# Patient Record
Sex: Male | Born: 2000 | Race: White | Hispanic: No | Marital: Single | Smoking: Never smoker
Health system: Southern US, Community
[De-identification: ages and names within clinical notes are randomized; demographics above are authoritative.]

---
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
## 2021-03-28 ENCOUNTER — Encounter: Payer: Self-pay | Admitting: Family Medicine

## 2021-03-28 ENCOUNTER — Ambulatory Visit (INDEPENDENT_AMBULATORY_CARE_PROVIDER_SITE_OTHER): Payer: 59 | Admitting: Family Medicine

## 2021-03-28 ENCOUNTER — Other Ambulatory Visit: Payer: Self-pay

## 2021-03-28 ENCOUNTER — Ambulatory Visit
Admission: RE | Admit: 2021-03-28 | Discharge: 2021-03-28 | Disposition: A | Discharge status: Home / Self Care | Payer: 59 | Source: Ambulatory Visit | Attending: Family Medicine | Admitting: Family Medicine

## 2021-03-28 VITALS — BP 124/78 | Ht 74.5 in | Wt 204.0 lb

## 2021-03-28 DIAGNOSIS — M25561 Pain in right knee: Secondary | ICD-10-CM

## 2021-03-28 DIAGNOSIS — S838X1A Sprain of other specified parts of right knee, initial encounter: Secondary | ICD-10-CM

## 2021-03-28 DIAGNOSIS — S83411A Sprain of medial collateral ligament of right knee, initial encounter: Secondary | ICD-10-CM

## 2021-03-28 NOTE — Progress Notes (Signed)
   Azim Gillingham is a 20 y.o. male who presents to [facility] today for the following:  Right knee pain: On 7/6, he was squatting for max weight weight 455lbs and had to bail but did it with improper technique and had immediate pain that feels like a stretching pain behind his knee.  Also noted right medial pain near the same time that improved but is now worsening. Has to limp when walking  Knee is swollen but improved from prior Never had injury or surgery to the area. Has been using ice and ibuprofen with minimal improvement Pain worse with full extension    PMH reviewed.  ROS as above. Medications reviewed.  Exam:  BP 124/78   Ht 6' 2.5" (1.892 m)   Wt 204 lb (92.5 kg)   BMI 25.84 kg/m  Gen: Well-appearing, NAD MSK:  Right Knee: - Inspection: no gross deformity. Mild swelling of the posterior knee. No  erythema or bruising. Skin intact - Palpation: TTP over posterior and medial knee - ROM: full active ROM with flexion and extension in knee and hip bilaterally, though pain with full extension of the right knee - Strength: 5/5 strength - Neuro/vasc: NV intact - Special Tests: - LIGAMENTS: negative anterior and posterior drawer, negative Lachman's, no MCL or LCL laxity.  Negative dial.  Pain on MCL stress. -- MENISCUS: Mildly positive McMurray's, [place]ss[place].  Negative apley. -- PF JOINT: nml patellar mobility bilaterally.  negative patellar grind, negative patellar apprehension  Limited MSK u/s right knee: no effusion.  No visible medial meniscus tear.  No baker's cyst.  Assessment and Plan: 1) Right knee pain - consistent with Grade 1 MCL sprain, meniscal contusion.  Radiographs with tunnel view to assess for osteochondral defect.  Reassuring he doesn't have an effusion as well and video of injury without twisting injury (fell forward with some valgus to right knee).  Icing, ibuprofen.  Knee brace for support.  Out of work until f/u in 1.5 weeks.  Consider MRI if not  improving.   Ranald Alessio, DO

## 2021-03-28 NOTE — Patient Instructions (Signed)
Get x-rays of your knee after you leave today - we will contact you with results. You have an MCL sprain and medial meniscus contusion. Ice the knee 15 minutes at a time 3-4 times a day. Ibuprofen 600mg  three times a day with food for pain and inflammation - consider taking regularly for the next week then as needed. Knee brace when up and walking around. Out of work until I see you back next Wednesday. If x-rays look ok and you're not improving we will consider an MRI but your exam is reassuring otherwise. Follow up with me next Wednesday if that works with your schedule.

## 2021-04-06 ENCOUNTER — Encounter: Payer: Self-pay | Admitting: Family Medicine

## 2021-04-06 ENCOUNTER — Ambulatory Visit (INDEPENDENT_AMBULATORY_CARE_PROVIDER_SITE_OTHER): Payer: 59 | Admitting: Family Medicine

## 2021-04-06 ENCOUNTER — Other Ambulatory Visit: Payer: Self-pay

## 2021-04-06 ENCOUNTER — Ambulatory Visit: Payer: 59 | Admitting: Family Medicine

## 2021-04-06 VITALS — Ht 74.5 in | Wt 205.0 lb

## 2021-04-06 DIAGNOSIS — S83411D Sprain of medial collateral ligament of right knee, subsequent encounter: Secondary | ICD-10-CM | POA: Diagnosis not present

## 2021-04-06 DIAGNOSIS — M25561 Pain in right knee: Secondary | ICD-10-CM | POA: Diagnosis not present

## 2021-04-06 NOTE — Progress Notes (Signed)
PCP: Garey Ham, MD  Subjective:   HPI: Patient is a 20 y.o. male here for follow-up of right knee pain.  Patient initially had injury on 03/23/2021 when he bailed from a max weight of squatting and had pain in his right medial knee.  Since then he has had x-rays that did not show any bony abnormality or fracture, no joint effusion or degenerative changes.  Today, the patient states that he feels much improved, feels like he is back to his previous baseline.  He has been walking, taking stairs, and some light jogging without any difficulty.  He has refrained from heavy lower body weight lifting.  He has been taking ibuprofen as needed for the pain, but this has been resolved over the last few days.  He denies any instability or giving out of the knee, reports edema in the knee has completely resolved.  He is inquiring about returning to work.  Ht 6' 2.5" (1.892 m)   Wt 205 lb (93 kg)   BMI 25.97 kg/m   [facility] 03/28/2021  Frequency of aerobic exercise (# of days/week) 0  Average time in minutes 0  Frequency of strengthening activities (# of days/week) 6    No flowsheet data found.  Review of Systems: See HPI above.     Objective:  Physical Exam:  Gen: Well-appearing, in no acute distress; non-toxic CV: Regular Rate. Well-perfused. Warm.  Resp: Breathing unlabored on room air; no wheezing. Psych: Fluid speech in conversation; appropriate affect; normal thought process Neuro: Sensation intact throughout. No gross coordination deficits.  MSK:   - Knee, right: Inspection was negative for erythema, ecchymosis, or effusion.  There were no obvious bony abnormalities.  There is no tenderness to palpation over the medial or lateral joint line, or patella.  There is no significant tenderness over the MCL insertion point.  5/5 strength of bilateral lower extremities in all directions at the knee and ankle.  No obvious Baker's cyst development palpated.  Range  of motion is full from 0 degrees in extension to approximately 135 degrees in flexion.  There is no instability or laxity with varus or valgus stress at 0 and 15 degrees, the MCL does feel intact with a firm endpoint.  Patient is neurovascular intact, gross sensation to light touch intact on plantar and dorsal aspect of the lower extremity.  Provocative Testing: Negative varus/valgus stress testing, McMurray's, anterior/posterior drawer.   Assessment & Plan:  1.  Right knee MCL sprain, grade 1 - improved.  Since last visit, patient's pain and function has completely improved.  He is ambulating, walking steps, and light jogging without any pain or instability.  Physical exam today shows an intact MCL without any laxity or pain with provocative measures.  I provided a return to work note for him, he may resume normal activity, although we discussed being cautious returning to activities that would cause rotation of the knee and weightlifting--begin at about 50%, and increase by  about 10% each session until back to baseline.  He may use NSAID as needed.  Return only as needed unless issues arise.  Return precautions provided.  Madelyn Brunner, DO PGY-4, Sports Medicine Fellow [facility]

## 2021-04-06 NOTE — Patient Instructions (Signed)
It was great to meet you today, thank you for letting me participate in your care!  Today, we discussed returning to work from your MCL sprain. It generally takes around 2 weeks (+/- a week) for healing. Your knee feels stable today on exam, so you can return to work and activity. Let pain be your guide and slowly ramp up on activities that will cause flexion/extension and especially rotation of the knee. Start back with squats, deadlift, etc. At about 50% of max/usual weight and slowly ramp up over the next 1-2 weeks.  You will follow-up only as needed if you do not feel back to 100%.  If you have any further questions, please give the clinic a call [phone].  Cheers,  Madelyn Brunner, DO PGY-4, Sports Medicine Fellow [facility]

## 2022-09-08 IMAGING — DX DG KNEE COMPLETE 4+V*R*
4 series · 4 of 4 positions shown · non-contrast
Comparison: None.

CLINICAL DATA: Acute right lateral knee pain, no reported injury.

EXAM:
RIGHT KNEE - COMPLETE 4+ VIEW

[dg knee complete 4 views right (1 of 4)]
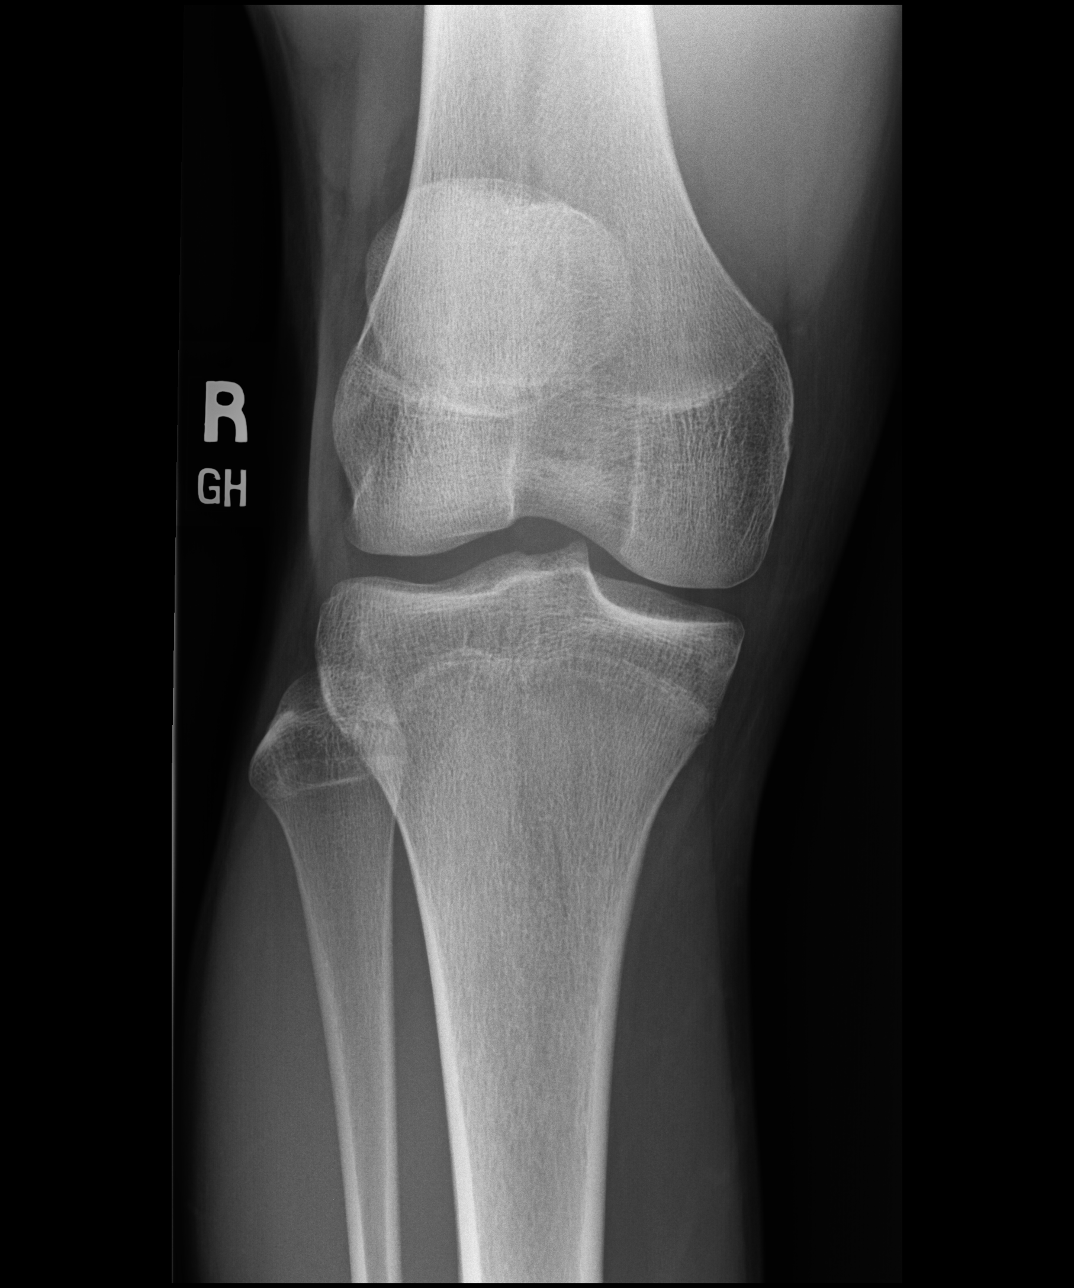

[dg knee complete 4 views right (2 of 4)]
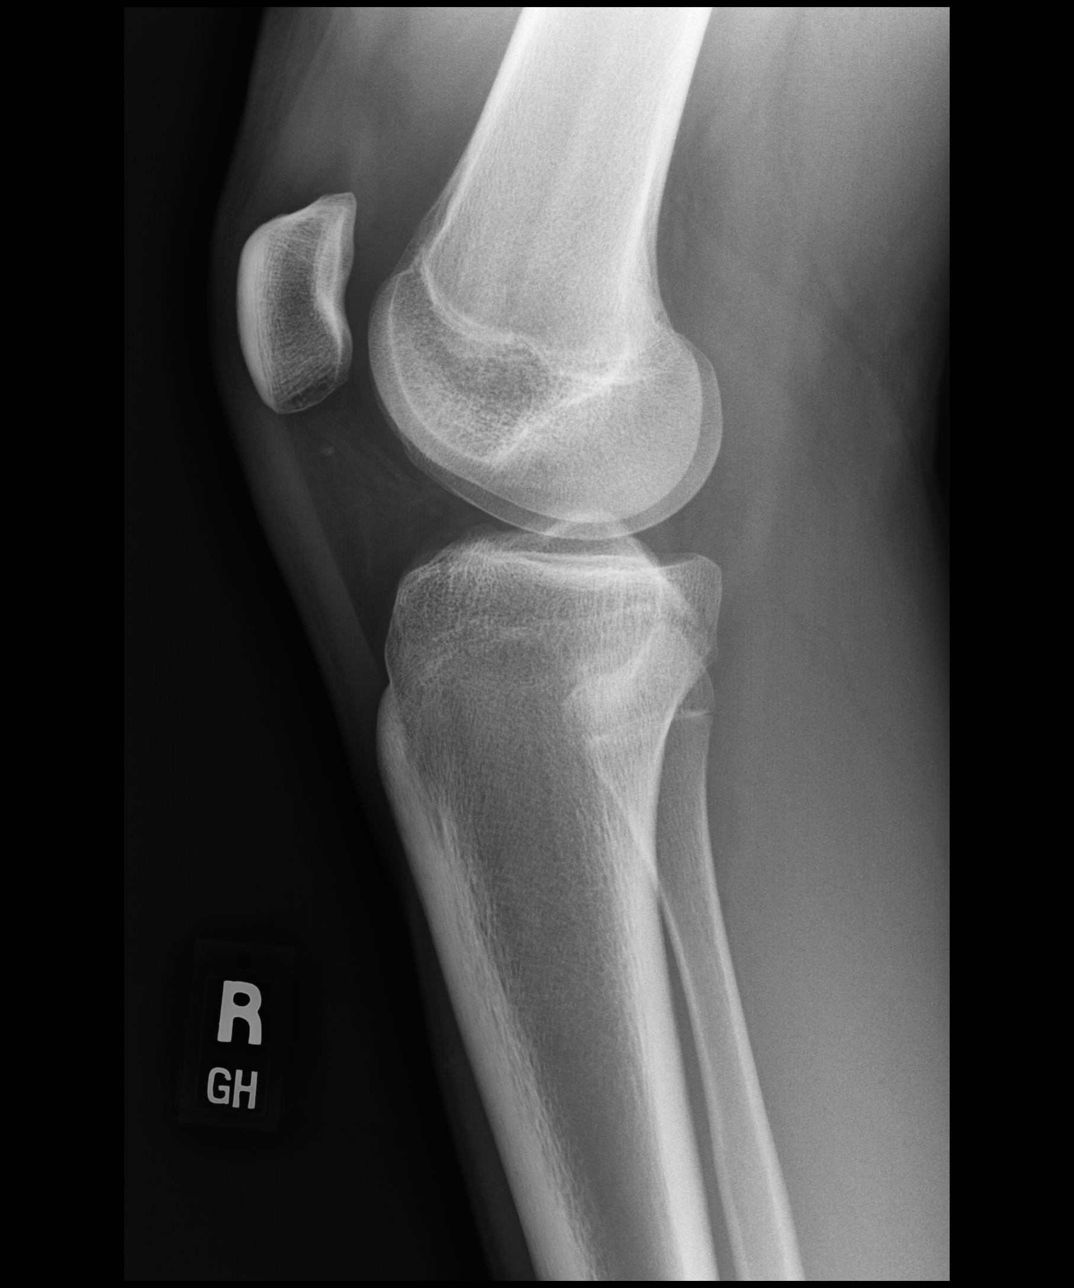

[dg knee complete 4 views right (3 of 4)]
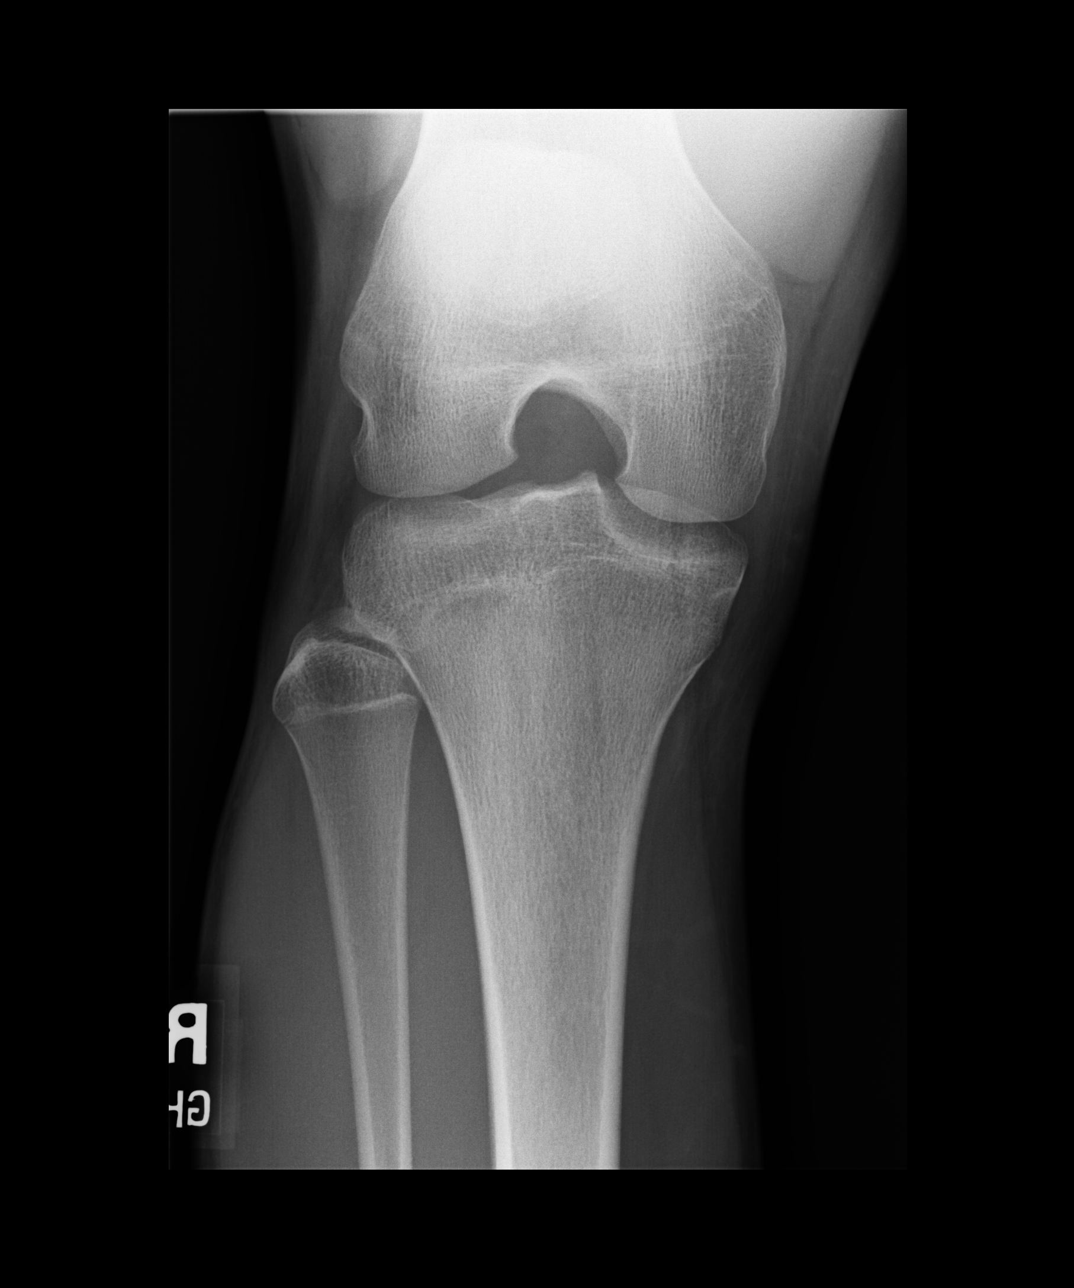

[dg knee complete 4 views right (4 of 4)]
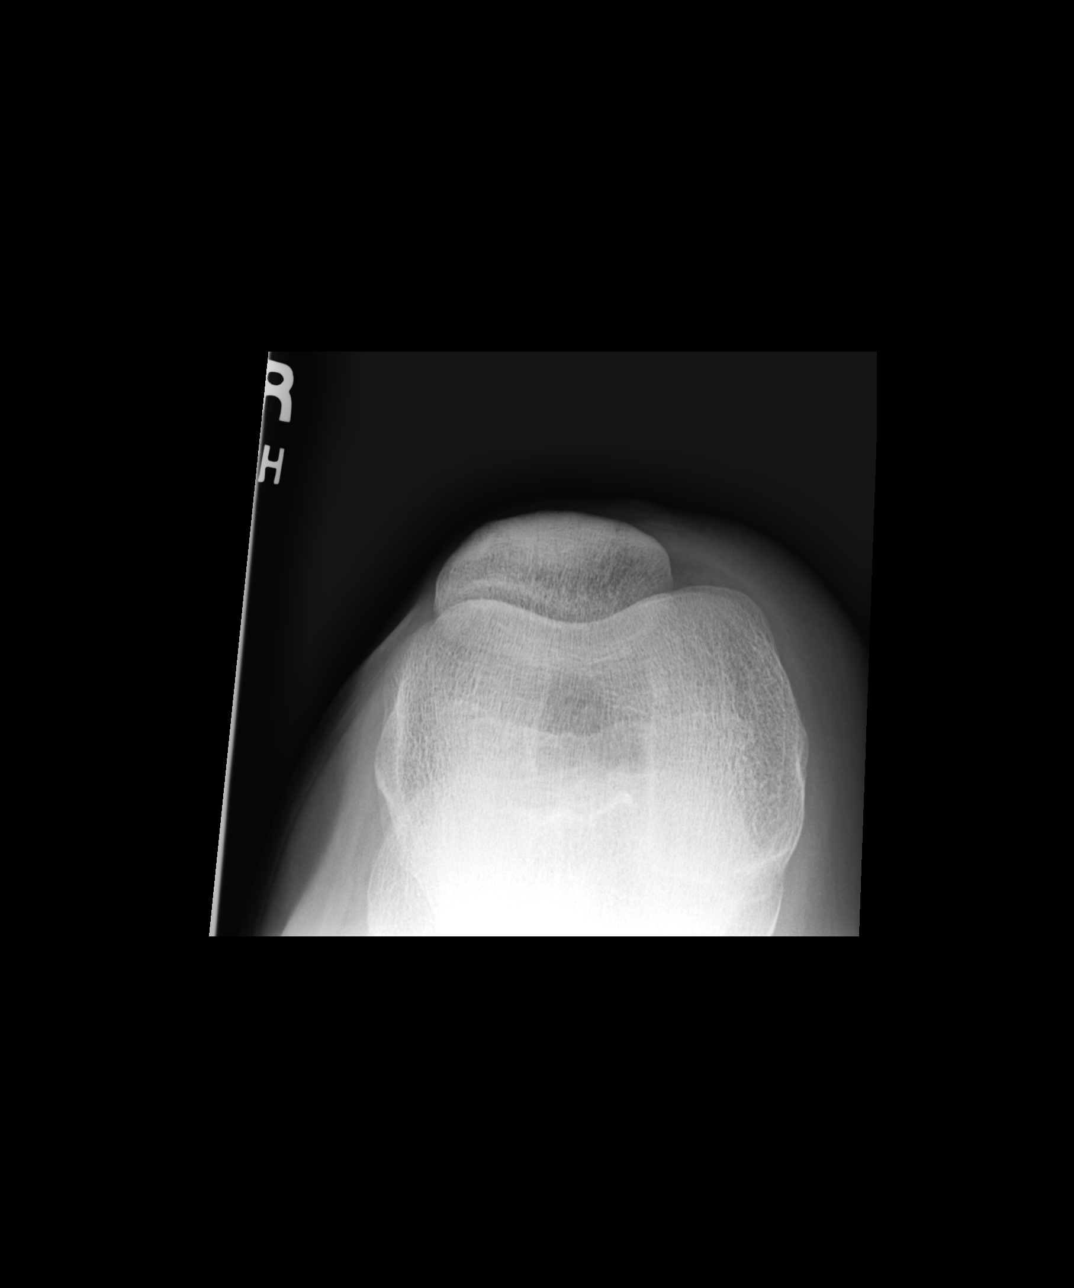

[4 of 4 positions shown; findings below may reference images not displayed]

FINDINGS: No joint effusion or fracture.  No degenerative changes.
IMPRESSION: Negative.
